# Patient Record
Sex: Male | Born: 2005 | Race: Black or African American | Hispanic: No | Marital: Single | Smoking: Never smoker
Health system: Southern US, Community
[De-identification: ages and names within clinical notes are randomized; demographics above are authoritative.]

## PROBLEM LIST (undated history)

## (undated) DIAGNOSIS — J301 Allergic rhinitis due to pollen: Secondary | ICD-10-CM

## (undated) DIAGNOSIS — J45909 Unspecified asthma, uncomplicated: Secondary | ICD-10-CM

## (undated) DIAGNOSIS — J302 Other seasonal allergic rhinitis: Secondary | ICD-10-CM

## (undated) HISTORY — DX: Unspecified asthma, uncomplicated: J45.909

## (undated) HISTORY — PX: OTHER SURGICAL HISTORY: SHX169

## (undated) HISTORY — DX: Allergic rhinitis due to pollen: J30.1

---
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
## 2024-12-25 ENCOUNTER — Encounter: Admitting: Family Medicine
